# Patient Record
Sex: Female | Born: 1950
Health system: Southern US, Community
[De-identification: ages and names within clinical notes are randomized; demographics above are authoritative.]

## PROBLEM LIST (undated history)

## (undated) DIAGNOSIS — I10 Essential (primary) hypertension: Secondary | ICD-10-CM

## (undated) HISTORY — DX: Essential (primary) hypertension: I10

---

## 2007-07-13 ENCOUNTER — Other Ambulatory Visit: Admission: RE | Admit: 2007-07-13 | Discharge: 2007-07-13 | Payer: Self-pay | Admitting: Family Medicine

## 2008-09-28 ENCOUNTER — Other Ambulatory Visit: Admission: RE | Admit: 2008-09-28 | Discharge: 2008-09-28 | Payer: Self-pay | Admitting: Family Medicine

## 2009-09-30 ENCOUNTER — Other Ambulatory Visit: Admission: RE | Admit: 2009-09-30 | Discharge: 2009-09-30 | Payer: Self-pay | Admitting: Family Medicine

## 2010-06-18 ENCOUNTER — Emergency Department (HOSPITAL_COMMUNITY): Payer: BC Managed Care – PPO

## 2010-06-18 ENCOUNTER — Emergency Department (HOSPITAL_COMMUNITY)
Admission: EM | Admit: 2010-06-18 | Discharge: 2010-06-18 | Disposition: A | Discharge status: Home / Self Care | Payer: BC Managed Care – PPO | Attending: Emergency Medicine | Admitting: Emergency Medicine

## 2010-06-18 DIAGNOSIS — R42 Dizziness and giddiness: Secondary | ICD-10-CM | POA: Insufficient documentation

## 2010-06-18 DIAGNOSIS — R002 Palpitations: Secondary | ICD-10-CM | POA: Insufficient documentation

## 2010-06-18 DIAGNOSIS — R5381 Other malaise: Secondary | ICD-10-CM | POA: Insufficient documentation

## 2010-06-18 DIAGNOSIS — E860 Dehydration: Secondary | ICD-10-CM | POA: Insufficient documentation

## 2010-06-18 DIAGNOSIS — I498 Other specified cardiac arrhythmias: Secondary | ICD-10-CM | POA: Insufficient documentation

## 2010-06-18 DIAGNOSIS — Z79899 Other long term (current) drug therapy: Secondary | ICD-10-CM | POA: Insufficient documentation

## 2010-06-18 LAB — DIFFERENTIAL
Basophils Absolute: 0.1 10*3/uL (ref 0.0–0.1)
Basophils Relative: 1 % (ref 0–1)
Eosinophils Absolute: 0.1 10*3/uL (ref 0.0–0.7)
Eosinophils Relative: 1 % (ref 0–5)
Lymphocytes Relative: 17 % (ref 12–46)
Lymphs Abs: 1.6 10*3/uL (ref 0.7–4.0)
Monocytes Absolute: 0.8 10*3/uL (ref 0.1–1.0)
Monocytes Relative: 8 % (ref 3–12)
Neutro Abs: 7.3 10*3/uL (ref 1.7–7.7)
Neutrophils Relative %: 74 % (ref 43–77)

## 2010-06-18 LAB — BASIC METABOLIC PANEL
BUN: 15 mg/dL (ref 6–23)
CO2: 25 mEq/L (ref 19–32)
Calcium: 8.9 mg/dL (ref 8.4–10.5)
Chloride: 104 mEq/L (ref 96–112)
Creatinine, Ser: 1.05 mg/dL (ref 0.4–1.2)
GFR calc Af Amer: >60 mL/min (ref >60)
GFR calc non Af Amer: 54 mL/min — ABNORMAL LOW (ref >60)
Glucose, Bld: 143 mg/dL — ABNORMAL HIGH (ref 70–99)
Potassium: 3.9 mEq/L (ref 3.5–5.1)
Sodium: 139 mEq/L (ref 135–145)

## 2010-06-18 LAB — TROPONIN I: Troponin I: <0.01 ng/mL (ref 0.00–0.06)

## 2010-06-18 LAB — CBC
HCT: 37.8 % (ref 36.0–46.0)
Hemoglobin: 12.9 g/dL (ref 12.0–15.0)
MCH: 31.9 pg (ref 26.0–34.0)
MCHC: 34.1 g/dL (ref 30.0–36.0)
MCV: 93.3 fL (ref 78.0–100.0)
Platelets: 225 10*3/uL (ref 150–400)
RBC: 4.05 MIL/uL (ref 3.87–5.11)
RDW: 11.8 % (ref 11.5–15.5)
WBC: 9.9 10*3/uL (ref 4.0–10.5)

## 2010-06-18 LAB — D-DIMER, QUANTITATIVE: D-Dimer, Quant: 0.31 ug/mL-FEU (ref 0.00–0.48)

## 2010-06-18 LAB — CK TOTAL AND CKMB (NOT AT ARMC)
CK, MB: 2.2 ng/mL (ref 0.3–4.0)
Relative Index: INVALID (ref 0.0–2.5)
Total CK: 73 U/L (ref 7–177)

## 2010-07-24 ENCOUNTER — Other Ambulatory Visit: Payer: Self-pay | Admitting: Emergency Medicine

## 2010-07-24 ENCOUNTER — Ambulatory Visit
Admission: RE | Admit: 2010-07-24 | Discharge: 2010-07-24 | Disposition: A | Discharge status: Home / Self Care | Payer: Worker's Compensation | Source: Ambulatory Visit | Attending: Emergency Medicine | Admitting: Emergency Medicine

## 2010-07-24 DIAGNOSIS — R519 Headache, unspecified: Secondary | ICD-10-CM

## 2010-10-30 ENCOUNTER — Other Ambulatory Visit (HOSPITAL_COMMUNITY)
Admission: RE | Admit: 2010-10-30 | Discharge: 2010-10-30 | Disposition: A | Discharge status: Home / Self Care | Payer: BC Managed Care – PPO | Source: Ambulatory Visit | Attending: Family Medicine | Admitting: Family Medicine

## 2010-10-30 DIAGNOSIS — Z124 Encounter for screening for malignant neoplasm of cervix: Secondary | ICD-10-CM | POA: Insufficient documentation

## 2014-09-04 ENCOUNTER — Other Ambulatory Visit: Payer: Self-pay | Admitting: Family Medicine

## 2014-09-04 ENCOUNTER — Other Ambulatory Visit (HOSPITAL_COMMUNITY)
Admission: RE | Admit: 2014-09-04 | Discharge: 2014-09-04 | Disposition: A | Discharge status: Home / Self Care | Payer: BLUE CROSS/BLUE SHIELD | Source: Ambulatory Visit | Attending: Family Medicine | Admitting: Family Medicine

## 2014-09-04 DIAGNOSIS — Z124 Encounter for screening for malignant neoplasm of cervix: Secondary | ICD-10-CM | POA: Insufficient documentation

## 2014-09-07 LAB — CYTOLOGY - PAP

## 2015-08-27 DIAGNOSIS — R002 Palpitations: Secondary | ICD-10-CM | POA: Diagnosis not present

## 2015-08-29 ENCOUNTER — Ambulatory Visit (INDEPENDENT_AMBULATORY_CARE_PROVIDER_SITE_OTHER): Payer: BLUE CROSS/BLUE SHIELD

## 2015-08-29 DIAGNOSIS — R002 Palpitations: Secondary | ICD-10-CM | POA: Diagnosis not present

## 2015-09-16 DIAGNOSIS — N951 Menopausal and female climacteric states: Secondary | ICD-10-CM | POA: Diagnosis not present

## 2015-09-16 DIAGNOSIS — J309 Allergic rhinitis, unspecified: Secondary | ICD-10-CM | POA: Diagnosis not present

## 2015-09-16 DIAGNOSIS — I10 Essential (primary) hypertension: Secondary | ICD-10-CM | POA: Diagnosis not present

## 2015-09-16 DIAGNOSIS — Z0000 Encounter for general adult medical examination without abnormal findings: Secondary | ICD-10-CM | POA: Diagnosis not present

## 2016-09-29 DIAGNOSIS — N951 Menopausal and female climacteric states: Secondary | ICD-10-CM | POA: Diagnosis not present

## 2016-09-29 DIAGNOSIS — N898 Other specified noninflammatory disorders of vagina: Secondary | ICD-10-CM | POA: Diagnosis not present

## 2016-09-29 DIAGNOSIS — I10 Essential (primary) hypertension: Secondary | ICD-10-CM | POA: Diagnosis not present

## 2016-09-29 DIAGNOSIS — R002 Palpitations: Secondary | ICD-10-CM | POA: Diagnosis not present

## 2016-09-29 DIAGNOSIS — Z0000 Encounter for general adult medical examination without abnormal findings: Secondary | ICD-10-CM | POA: Diagnosis not present

## 2017-10-20 DIAGNOSIS — S30814A Abrasion of vagina and vulva, initial encounter: Secondary | ICD-10-CM | POA: Diagnosis not present

## 2017-10-20 DIAGNOSIS — Z23 Encounter for immunization: Secondary | ICD-10-CM | POA: Diagnosis not present

## 2017-10-20 DIAGNOSIS — I10 Essential (primary) hypertension: Secondary | ICD-10-CM | POA: Diagnosis not present

## 2017-10-20 DIAGNOSIS — Z0000 Encounter for general adult medical examination without abnormal findings: Secondary | ICD-10-CM | POA: Diagnosis not present

## 2017-11-04 ENCOUNTER — Other Ambulatory Visit: Payer: Self-pay | Admitting: Family Medicine

## 2017-11-04 DIAGNOSIS — S060X0A Concussion without loss of consciousness, initial encounter: Secondary | ICD-10-CM

## 2017-11-04 DIAGNOSIS — S060X9A Concussion with loss of consciousness of unspecified duration, initial encounter: Secondary | ICD-10-CM | POA: Diagnosis not present

## 2017-11-04 DIAGNOSIS — S199XXA Unspecified injury of neck, initial encounter: Secondary | ICD-10-CM | POA: Diagnosis not present

## 2017-11-05 ENCOUNTER — Other Ambulatory Visit: Payer: Self-pay | Admitting: Family Medicine

## 2017-11-05 ENCOUNTER — Ambulatory Visit
Admission: RE | Admit: 2017-11-05 | Discharge: 2017-11-05 | Disposition: A | Discharge status: Home / Self Care | Payer: BLUE CROSS/BLUE SHIELD | Source: Ambulatory Visit | Attending: Family Medicine | Admitting: Family Medicine

## 2017-11-05 DIAGNOSIS — M5031 Other cervical disc degeneration,  high cervical region: Secondary | ICD-10-CM | POA: Diagnosis not present

## 2017-11-05 DIAGNOSIS — T1490XA Injury, unspecified, initial encounter: Secondary | ICD-10-CM

## 2017-11-16 DIAGNOSIS — S060X9A Concussion with loss of consciousness of unspecified duration, initial encounter: Secondary | ICD-10-CM | POA: Diagnosis not present

## 2017-11-16 DIAGNOSIS — S199XXA Unspecified injury of neck, initial encounter: Secondary | ICD-10-CM | POA: Diagnosis not present

## 2017-11-22 DIAGNOSIS — M542 Cervicalgia: Secondary | ICD-10-CM | POA: Diagnosis not present

## 2017-11-22 DIAGNOSIS — M545 Low back pain: Secondary | ICD-10-CM | POA: Diagnosis not present

## 2017-11-22 DIAGNOSIS — S060X0D Concussion without loss of consciousness, subsequent encounter: Secondary | ICD-10-CM | POA: Diagnosis not present

## 2017-11-24 DIAGNOSIS — M545 Low back pain: Secondary | ICD-10-CM | POA: Diagnosis not present

## 2017-11-24 DIAGNOSIS — S060X0D Concussion without loss of consciousness, subsequent encounter: Secondary | ICD-10-CM | POA: Diagnosis not present

## 2017-11-24 DIAGNOSIS — M542 Cervicalgia: Secondary | ICD-10-CM | POA: Diagnosis not present

## 2017-11-30 DIAGNOSIS — M542 Cervicalgia: Secondary | ICD-10-CM | POA: Diagnosis not present

## 2017-11-30 DIAGNOSIS — M545 Low back pain: Secondary | ICD-10-CM | POA: Diagnosis not present

## 2017-11-30 DIAGNOSIS — S060X0D Concussion without loss of consciousness, subsequent encounter: Secondary | ICD-10-CM | POA: Diagnosis not present

## 2017-12-02 DIAGNOSIS — M542 Cervicalgia: Secondary | ICD-10-CM | POA: Diagnosis not present

## 2017-12-02 DIAGNOSIS — S060X0D Concussion without loss of consciousness, subsequent encounter: Secondary | ICD-10-CM | POA: Diagnosis not present

## 2017-12-02 DIAGNOSIS — M545 Low back pain: Secondary | ICD-10-CM | POA: Diagnosis not present

## 2017-12-08 DIAGNOSIS — M545 Low back pain: Secondary | ICD-10-CM | POA: Diagnosis not present

## 2017-12-08 DIAGNOSIS — M542 Cervicalgia: Secondary | ICD-10-CM | POA: Diagnosis not present

## 2017-12-08 DIAGNOSIS — S060X0D Concussion without loss of consciousness, subsequent encounter: Secondary | ICD-10-CM | POA: Diagnosis not present

## 2017-12-09 DIAGNOSIS — L678 Other hair color and hair shaft abnormalities: Secondary | ICD-10-CM | POA: Diagnosis not present

## 2017-12-09 DIAGNOSIS — M549 Dorsalgia, unspecified: Secondary | ICD-10-CM | POA: Diagnosis not present

## 2017-12-09 DIAGNOSIS — M542 Cervicalgia: Secondary | ICD-10-CM | POA: Diagnosis not present

## 2017-12-10 DIAGNOSIS — M545 Low back pain: Secondary | ICD-10-CM | POA: Diagnosis not present

## 2017-12-10 DIAGNOSIS — M542 Cervicalgia: Secondary | ICD-10-CM | POA: Diagnosis not present

## 2017-12-10 DIAGNOSIS — S060X0D Concussion without loss of consciousness, subsequent encounter: Secondary | ICD-10-CM | POA: Diagnosis not present

## 2017-12-14 DIAGNOSIS — M545 Low back pain: Secondary | ICD-10-CM | POA: Diagnosis not present

## 2017-12-14 DIAGNOSIS — S060X0D Concussion without loss of consciousness, subsequent encounter: Secondary | ICD-10-CM | POA: Diagnosis not present

## 2017-12-14 DIAGNOSIS — M542 Cervicalgia: Secondary | ICD-10-CM | POA: Diagnosis not present

## 2017-12-16 DIAGNOSIS — M545 Low back pain: Secondary | ICD-10-CM | POA: Diagnosis not present

## 2017-12-16 DIAGNOSIS — S060X0D Concussion without loss of consciousness, subsequent encounter: Secondary | ICD-10-CM | POA: Diagnosis not present

## 2017-12-16 DIAGNOSIS — M542 Cervicalgia: Secondary | ICD-10-CM | POA: Diagnosis not present

## 2017-12-21 DIAGNOSIS — M545 Low back pain: Secondary | ICD-10-CM | POA: Diagnosis not present

## 2017-12-21 DIAGNOSIS — S060X0D Concussion without loss of consciousness, subsequent encounter: Secondary | ICD-10-CM | POA: Diagnosis not present

## 2017-12-21 DIAGNOSIS — M542 Cervicalgia: Secondary | ICD-10-CM | POA: Diagnosis not present

## 2017-12-28 DIAGNOSIS — M545 Low back pain: Secondary | ICD-10-CM | POA: Diagnosis not present

## 2017-12-28 DIAGNOSIS — M542 Cervicalgia: Secondary | ICD-10-CM | POA: Diagnosis not present

## 2017-12-28 DIAGNOSIS — S060X0D Concussion without loss of consciousness, subsequent encounter: Secondary | ICD-10-CM | POA: Diagnosis not present

## 2018-01-11 DIAGNOSIS — S060X0D Concussion without loss of consciousness, subsequent encounter: Secondary | ICD-10-CM | POA: Diagnosis not present

## 2018-01-11 DIAGNOSIS — M542 Cervicalgia: Secondary | ICD-10-CM | POA: Diagnosis not present

## 2018-01-11 DIAGNOSIS — M545 Low back pain: Secondary | ICD-10-CM | POA: Diagnosis not present

## 2018-01-18 DIAGNOSIS — M545 Low back pain: Secondary | ICD-10-CM | POA: Diagnosis not present

## 2018-01-18 DIAGNOSIS — M542 Cervicalgia: Secondary | ICD-10-CM | POA: Diagnosis not present

## 2018-01-18 DIAGNOSIS — S060X0D Concussion without loss of consciousness, subsequent encounter: Secondary | ICD-10-CM | POA: Diagnosis not present

## 2018-01-25 DIAGNOSIS — M542 Cervicalgia: Secondary | ICD-10-CM | POA: Diagnosis not present

## 2018-01-25 DIAGNOSIS — M545 Low back pain: Secondary | ICD-10-CM | POA: Diagnosis not present

## 2018-01-25 DIAGNOSIS — S060X0D Concussion without loss of consciousness, subsequent encounter: Secondary | ICD-10-CM | POA: Diagnosis not present

## 2018-10-25 DIAGNOSIS — Z0000 Encounter for general adult medical examination without abnormal findings: Secondary | ICD-10-CM | POA: Diagnosis not present

## 2018-10-26 DIAGNOSIS — Z23 Encounter for immunization: Secondary | ICD-10-CM | POA: Diagnosis not present

## 2018-10-26 DIAGNOSIS — Z1159 Encounter for screening for other viral diseases: Secondary | ICD-10-CM | POA: Diagnosis not present

## 2018-10-26 DIAGNOSIS — I10 Essential (primary) hypertension: Secondary | ICD-10-CM | POA: Diagnosis not present

## 2018-10-28 ENCOUNTER — Other Ambulatory Visit: Payer: Self-pay | Admitting: Family Medicine

## 2018-10-28 DIAGNOSIS — Z1231 Encounter for screening mammogram for malignant neoplasm of breast: Secondary | ICD-10-CM

## 2018-10-28 DIAGNOSIS — E2839 Other primary ovarian failure: Secondary | ICD-10-CM

## 2019-01-02 DIAGNOSIS — Z86010 Personal history of colonic polyps: Secondary | ICD-10-CM | POA: Diagnosis not present

## 2019-01-06 ENCOUNTER — Ambulatory Visit
Admission: RE | Admit: 2019-01-06 | Discharge: 2019-01-06 | Disposition: A | Discharge status: Home / Self Care | Payer: BLUE CROSS/BLUE SHIELD | Source: Ambulatory Visit | Attending: Family Medicine | Admitting: Family Medicine

## 2019-01-06 ENCOUNTER — Other Ambulatory Visit: Payer: Self-pay

## 2019-01-06 DIAGNOSIS — Z780 Asymptomatic menopausal state: Secondary | ICD-10-CM | POA: Diagnosis not present

## 2019-01-06 DIAGNOSIS — Z1231 Encounter for screening mammogram for malignant neoplasm of breast: Secondary | ICD-10-CM | POA: Diagnosis not present

## 2019-01-06 DIAGNOSIS — M85852 Other specified disorders of bone density and structure, left thigh: Secondary | ICD-10-CM | POA: Diagnosis not present

## 2019-01-06 DIAGNOSIS — E2839 Other primary ovarian failure: Secondary | ICD-10-CM

## 2019-01-09 ENCOUNTER — Other Ambulatory Visit: Payer: Self-pay | Admitting: Family Medicine

## 2019-01-09 DIAGNOSIS — R928 Other abnormal and inconclusive findings on diagnostic imaging of breast: Secondary | ICD-10-CM

## 2019-01-11 ENCOUNTER — Ambulatory Visit
Admission: RE | Admit: 2019-01-11 | Discharge: 2019-01-11 | Disposition: A | Discharge status: Home / Self Care | Payer: BC Managed Care – PPO | Source: Ambulatory Visit | Attending: Family Medicine | Admitting: Family Medicine

## 2019-01-11 ENCOUNTER — Other Ambulatory Visit: Payer: Self-pay

## 2019-01-11 DIAGNOSIS — R928 Other abnormal and inconclusive findings on diagnostic imaging of breast: Secondary | ICD-10-CM

## 2019-02-07 DIAGNOSIS — Z1159 Encounter for screening for other viral diseases: Secondary | ICD-10-CM | POA: Diagnosis not present

## 2019-02-10 DIAGNOSIS — Z86010 Personal history of colonic polyps: Secondary | ICD-10-CM | POA: Diagnosis not present

## 2019-05-04 DIAGNOSIS — Z03818 Encounter for observation for suspected exposure to other biological agents ruled out: Secondary | ICD-10-CM | POA: Diagnosis not present

## 2019-12-08 DIAGNOSIS — Z0000 Encounter for general adult medical examination without abnormal findings: Secondary | ICD-10-CM | POA: Diagnosis not present

## 2019-12-08 DIAGNOSIS — I10 Essential (primary) hypertension: Secondary | ICD-10-CM | POA: Diagnosis not present

## 2019-12-08 DIAGNOSIS — N951 Menopausal and female climacteric states: Secondary | ICD-10-CM | POA: Diagnosis not present

## 2019-12-08 DIAGNOSIS — R002 Palpitations: Secondary | ICD-10-CM | POA: Diagnosis not present

## 2019-12-08 DIAGNOSIS — Z13220 Encounter for screening for lipoid disorders: Secondary | ICD-10-CM | POA: Diagnosis not present

## 2019-12-08 DIAGNOSIS — J309 Allergic rhinitis, unspecified: Secondary | ICD-10-CM | POA: Diagnosis not present

## 2020-01-11 DIAGNOSIS — I129 Hypertensive chronic kidney disease with stage 1 through stage 4 chronic kidney disease, or unspecified chronic kidney disease: Secondary | ICD-10-CM | POA: Diagnosis not present

## 2020-01-11 DIAGNOSIS — N183 Chronic kidney disease, stage 3 unspecified: Secondary | ICD-10-CM | POA: Diagnosis not present

## 2020-01-11 DIAGNOSIS — J309 Allergic rhinitis, unspecified: Secondary | ICD-10-CM | POA: Diagnosis not present

## 2020-02-29 ENCOUNTER — Other Ambulatory Visit: Payer: Self-pay | Admitting: Family Medicine

## 2020-02-29 DIAGNOSIS — Z1231 Encounter for screening mammogram for malignant neoplasm of breast: Secondary | ICD-10-CM

## 2020-03-01 ENCOUNTER — Other Ambulatory Visit: Payer: Self-pay

## 2020-03-01 ENCOUNTER — Ambulatory Visit
Admission: RE | Admit: 2020-03-01 | Discharge: 2020-03-01 | Disposition: A | Discharge status: Home / Self Care | Payer: BC Managed Care – PPO | Source: Ambulatory Visit | Attending: Family Medicine | Admitting: Family Medicine

## 2020-03-01 DIAGNOSIS — Z1231 Encounter for screening mammogram for malignant neoplasm of breast: Secondary | ICD-10-CM | POA: Diagnosis not present

## 2020-03-07 DIAGNOSIS — N183 Chronic kidney disease, stage 3 unspecified: Secondary | ICD-10-CM | POA: Diagnosis not present

## 2020-03-07 DIAGNOSIS — N950 Postmenopausal bleeding: Secondary | ICD-10-CM | POA: Diagnosis not present

## 2020-03-07 DIAGNOSIS — R319 Hematuria, unspecified: Secondary | ICD-10-CM | POA: Diagnosis not present

## 2020-03-27 DIAGNOSIS — N858 Other specified noninflammatory disorders of uterus: Secondary | ICD-10-CM | POA: Diagnosis not present

## 2020-03-27 DIAGNOSIS — N950 Postmenopausal bleeding: Secondary | ICD-10-CM | POA: Diagnosis not present

## 2020-04-08 DIAGNOSIS — N858 Other specified noninflammatory disorders of uterus: Secondary | ICD-10-CM | POA: Diagnosis not present

## 2020-04-08 DIAGNOSIS — N950 Postmenopausal bleeding: Secondary | ICD-10-CM | POA: Diagnosis not present

## 2020-05-21 DIAGNOSIS — I129 Hypertensive chronic kidney disease with stage 1 through stage 4 chronic kidney disease, or unspecified chronic kidney disease: Secondary | ICD-10-CM | POA: Diagnosis not present

## 2020-06-10 DIAGNOSIS — I129 Hypertensive chronic kidney disease with stage 1 through stage 4 chronic kidney disease, or unspecified chronic kidney disease: Secondary | ICD-10-CM | POA: Diagnosis not present

## 2020-06-10 DIAGNOSIS — E7800 Pure hypercholesterolemia, unspecified: Secondary | ICD-10-CM | POA: Diagnosis not present

## 2020-06-10 DIAGNOSIS — J309 Allergic rhinitis, unspecified: Secondary | ICD-10-CM | POA: Diagnosis not present

## 2020-09-30 DIAGNOSIS — K625 Hemorrhage of anus and rectum: Secondary | ICD-10-CM | POA: Diagnosis not present

## 2020-12-12 DIAGNOSIS — Z0000 Encounter for general adult medical examination without abnormal findings: Secondary | ICD-10-CM | POA: Diagnosis not present

## 2020-12-12 DIAGNOSIS — Z23 Encounter for immunization: Secondary | ICD-10-CM | POA: Diagnosis not present

## 2020-12-12 DIAGNOSIS — I129 Hypertensive chronic kidney disease with stage 1 through stage 4 chronic kidney disease, or unspecified chronic kidney disease: Secondary | ICD-10-CM | POA: Diagnosis not present

## 2020-12-12 DIAGNOSIS — E7800 Pure hypercholesterolemia, unspecified: Secondary | ICD-10-CM | POA: Diagnosis not present

## 2021-02-03 ENCOUNTER — Other Ambulatory Visit: Payer: Self-pay | Admitting: Family Medicine

## 2021-02-03 DIAGNOSIS — Z1231 Encounter for screening mammogram for malignant neoplasm of breast: Secondary | ICD-10-CM

## 2021-02-04 DIAGNOSIS — R079 Chest pain, unspecified: Secondary | ICD-10-CM | POA: Diagnosis not present

## 2021-02-04 DIAGNOSIS — K219 Gastro-esophageal reflux disease without esophagitis: Secondary | ICD-10-CM | POA: Diagnosis not present

## 2021-02-13 DIAGNOSIS — Z23 Encounter for immunization: Secondary | ICD-10-CM | POA: Diagnosis not present

## 2021-03-12 ENCOUNTER — Other Ambulatory Visit: Payer: Self-pay

## 2021-03-12 ENCOUNTER — Ambulatory Visit
Admission: RE | Admit: 2021-03-12 | Discharge: 2021-03-12 | Disposition: A | Discharge status: Home / Self Care | Payer: BC Managed Care – PPO | Source: Ambulatory Visit | Attending: Family Medicine | Admitting: Family Medicine

## 2021-03-12 DIAGNOSIS — Z1231 Encounter for screening mammogram for malignant neoplasm of breast: Secondary | ICD-10-CM

## 2021-07-15 DIAGNOSIS — E7800 Pure hypercholesterolemia, unspecified: Secondary | ICD-10-CM | POA: Diagnosis not present

## 2021-12-16 DIAGNOSIS — K219 Gastro-esophageal reflux disease without esophagitis: Secondary | ICD-10-CM | POA: Diagnosis not present

## 2021-12-16 DIAGNOSIS — M8588 Other specified disorders of bone density and structure, other site: Secondary | ICD-10-CM | POA: Diagnosis not present

## 2021-12-16 DIAGNOSIS — E7800 Pure hypercholesterolemia, unspecified: Secondary | ICD-10-CM | POA: Diagnosis not present

## 2021-12-16 DIAGNOSIS — Z0000 Encounter for general adult medical examination without abnormal findings: Secondary | ICD-10-CM | POA: Diagnosis not present

## 2021-12-16 DIAGNOSIS — R002 Palpitations: Secondary | ICD-10-CM | POA: Diagnosis not present

## 2021-12-16 DIAGNOSIS — I129 Hypertensive chronic kidney disease with stage 1 through stage 4 chronic kidney disease, or unspecified chronic kidney disease: Secondary | ICD-10-CM | POA: Diagnosis not present

## 2021-12-18 ENCOUNTER — Other Ambulatory Visit: Payer: Self-pay | Admitting: Family Medicine

## 2021-12-18 DIAGNOSIS — M8589 Other specified disorders of bone density and structure, multiple sites: Secondary | ICD-10-CM

## 2022-03-09 ENCOUNTER — Other Ambulatory Visit: Payer: Self-pay | Admitting: Internal Medicine

## 2022-03-09 DIAGNOSIS — Z1231 Encounter for screening mammogram for malignant neoplasm of breast: Secondary | ICD-10-CM

## 2022-03-11 DIAGNOSIS — L719 Rosacea, unspecified: Secondary | ICD-10-CM | POA: Diagnosis not present

## 2022-03-11 DIAGNOSIS — L821 Other seborrheic keratosis: Secondary | ICD-10-CM | POA: Diagnosis not present

## 2022-03-13 ENCOUNTER — Ambulatory Visit
Admission: EM | Admit: 2022-03-13 | Discharge: 2022-03-13 | Disposition: A | Discharge status: Home / Self Care | Payer: BC Managed Care – PPO | Attending: Family Medicine | Admitting: Family Medicine

## 2022-03-13 ENCOUNTER — Encounter: Payer: Self-pay | Admitting: Emergency Medicine

## 2022-03-13 DIAGNOSIS — J069 Acute upper respiratory infection, unspecified: Secondary | ICD-10-CM | POA: Diagnosis not present

## 2022-03-13 DIAGNOSIS — Z1152 Encounter for screening for COVID-19: Secondary | ICD-10-CM | POA: Diagnosis not present

## 2022-03-13 DIAGNOSIS — R059 Cough, unspecified: Secondary | ICD-10-CM | POA: Insufficient documentation

## 2022-03-13 LAB — RESP PANEL BY RT-PCR (RSV, FLU A&B, COVID)  RVPGX2
Influenza A by PCR: NEGATIVE
Influenza B by PCR: NEGATIVE
Resp Syncytial Virus by PCR: NEGATIVE
SARS Coronavirus 2 by RT PCR: NEGATIVE

## 2022-03-13 NOTE — ED Triage Notes (Signed)
Patient c/o body aches, productive cough, head and chest congestion x 3 days.  Patient has taken Tylenol and an OTC nasal spray.  Patient has not flu shot.

## 2022-03-13 NOTE — Discharge Instructions (Addendum)
Take plain guaifenesin (1200mg  extended release tabs such as Mucinex) twice daily, with plenty of water, for cough and congestion.  Get adequate rest.   For sinus congestion may use Afrin nasal spray (or generic oxymetazoline) each morning for about 5 days and then discontinue.  Also recommend using saline nasal spray several times daily and saline nasal irrigation (AYR is a common brand).  Use Flonase nasal spray each morning after using Afrin nasal spray and saline nasal irrigation. Try warm salt water gargles for sore throat.  Stop all antihistamines (Allegra, etc) for now, and other non-prescription cough/cold preparations. May take Delsym Cough Suppressant ("12 Hour Cough Relief") at bedtime for nighttime cough.   If symptoms become significantly worse during the night or over the weekend, proceed to the local emergency room.

## 2022-03-13 NOTE — ED Provider Notes (Signed)
[facility]    CSN: [id number] Arrival date & time: 03/13/22  3474      History   Chief Complaint Chief Complaint  Patient presents with   Generalized Body Aches    HPI Cyrstal Leitz is a 71 y.o. female.   Three days ago patient suddenly developed fatigue, myalgias, hoarseness, mild headache, and cough. Her ears have felt full and she had sweats last night.  She feels slightly short of breath at times without wheezing or pleuritic pain.  Her husband has similar symptoms. She was with her young grandson several days ago who had cold-like symptoms.  The history is provided by the patient.    History reviewed. No pertinent past medical history.  Patient Active Problem List   Diagnosis Date Noted   Palpitations 08/29/2015    History reviewed. No pertinent surgical history.  OB History   No obstetric history on file.      Home Medications    Prior to Admission medications   Medication Sig Start Date End Date Taking? Authorizing Provider  metoprolol succinate (TOPROL-XL) 25 MG 24 hr tablet Take 1 tablet by mouth daily. 11/14/14  Yes [provider]  Vitamin D, Ergocalciferol, (DRISDOL) 1.25 MG (50000 UNIT) CAPS capsule Take by mouth.   Yes [provider]    Family History Family History  Problem Relation Age of Onset   Breast cancer Maternal Aunt        diagnosed early 46's    Social History Social History   Tobacco Use   Smoking status: Never   Smokeless tobacco: Never  Vaping Use   Vaping Use: Never used  Substance Use Topics   Alcohol use: Yes   Drug use: Never     Allergies   Patient has no known allergies.   Review of Systems Review of Systems + sore throat + hoarse + cough No pleuritic pain No wheezing + nasal congestion + post-nasal drainage No sinus pain/pressure No itchy/red eyes ? Left earache No hemoptysis No SOB ? fever, + sweats No nausea No vomiting No abdominal pain No  diarrhea No urinary symptoms No skin rash + fatigue+ + myalgias + headache Used OTC meds (Allegra) without relief   Physical Exam Triage Vital Signs ED Triage Vitals  Enc Vitals Group     BP 03/13/22 1004 (!) 148/81     Pulse Rate 03/13/22 1004 71     Resp 03/13/22 1004 18     Temp 03/13/22 1004 99.3 F (37.4 C)     Temp Source 03/13/22 1004 Oral     SpO2 03/13/22 1004 97 %     Weight 03/13/22 1005 145 lb (65.8 kg)     Height 03/13/22 1005 5\' 5"  (1.651 m)     Head Circumference --      Peak Flow --      Pain Score 03/13/22 1005 0     Pain Loc --      Pain Edu? --      Excl. in [place]? --    No data found.  Updated Vital Signs BP (!) 148/81 (BP Location: Right Arm)   Pulse 71   Temp 99.3 F (37.4 C) (Oral)   Resp 18   Ht 5\' 5"  (1.651 m)   Wt 65.8 kg   SpO2 97%   BMI 24.13 kg/m   Visual Acuity Right Eye Distance:   Left Eye Distance:   Bilateral Distance:    Right Eye Near:   Left Eye  Near:    Bilateral Near:     Physical Exam Nursing notes and Vital Signs reviewed. Appearance:  Patient appears stated age, and in no acute distress Eyes:  Pupils are equal, round, and reactive to light and accomodation.  Extraocular movement is intact.  Conjunctivae are not inflamed  Ears:  Canals normal.  Tympanic membranes normal.  Nose:  Mildly congested turbinates.  No sinus tenderness.  Pharynx:  Normal Neck:  Supple.  Mildly enlarged lateral nodes are present, tender to palpation on the left.   Lungs:  Clear to auscultation.  Breath sounds are equal.  Moving air well. Heart:  Regular rate and rhythm without murmurs, rubs, or gallops.  Abdomen:  Nontender without masses or hepatosplenomegaly.  Bowel sounds are present.  No CVA or flank tenderness.  Extremities:  No edema.  Skin:  No rash present.   UC Treatments / Results  Labs (all labs ordered are listed, but only abnormal results are displayed) Labs Reviewed  RESP PANEL BY RT-PCR (RSV, FLU A&B, COVID)  [id number]     EKG   Radiology No results found.  Procedures Procedures (including critical care time)  Medications Ordered in UC Medications - No data to display  Initial Impression / Assessment and Plan / UC Course  I have reviewed the triage vital signs and the nursing notes.  Pertinent labs & imaging results that were available during my care of the patient were reviewed by me and considered in my medical decision making (see chart for details).    Benign exam. Treat symptomatically for now. Resp panel pending. Followup with Family Doctor if not improved in one week.   Final Clinical Impressions(s) / UC Diagnoses   Final diagnoses:  Viral URI with cough     Discharge Instructions      Take plain guaifenesin (1200mg  extended release tabs such as Mucinex) twice daily, with plenty of water, for cough and congestion.  Get adequate rest.   For sinus congestion may use Afrin nasal spray (or generic oxymetazoline) each morning for about 5 days and then discontinue.  Also recommend using saline nasal spray several times daily and saline nasal irrigation (AYR is a common brand).  Use Flonase nasal spray each morning after using Afrin nasal spray and saline nasal irrigation. Try warm salt water gargles for sore throat.  Stop all antihistamines (Allegra, etc) for now, and other non-prescription cough/cold preparations. May take Delsym Cough Suppressant ("12 Hour Cough Relief") at bedtime for nighttime cough.   If symptoms become significantly worse during the night or over the weekend, proceed to the local emergency room.      ED Prescriptions   None       , MD 03/15/22 1117

## 2022-03-14 ENCOUNTER — Telehealth: Payer: Self-pay | Admitting: Emergency Medicine

## 2022-03-14 NOTE — Telephone Encounter (Signed)
Call to see if pt had any questions or concerns. No answer - call back # left

## 2022-04-11 IMAGING — MG MM DIGITAL SCREENING BILAT W/ TOMO AND CAD
8 series · 8 of 24 positions shown · non-contrast
Comparison: Previous exam(s).

CLINICAL DATA: Screening.

EXAM:
DIGITAL SCREENING BILATERAL MAMMOGRAM WITH TOMOSYNTHESIS AND CAD
TECHNIQUE: Bilateral screening digital craniocaudal and mediolateral oblique
mammograms were obtained. Bilateral screening digital breast
tomosynthesis was performed. The images were evaluated with
computer-aided detection.

[R CC synth-2D]
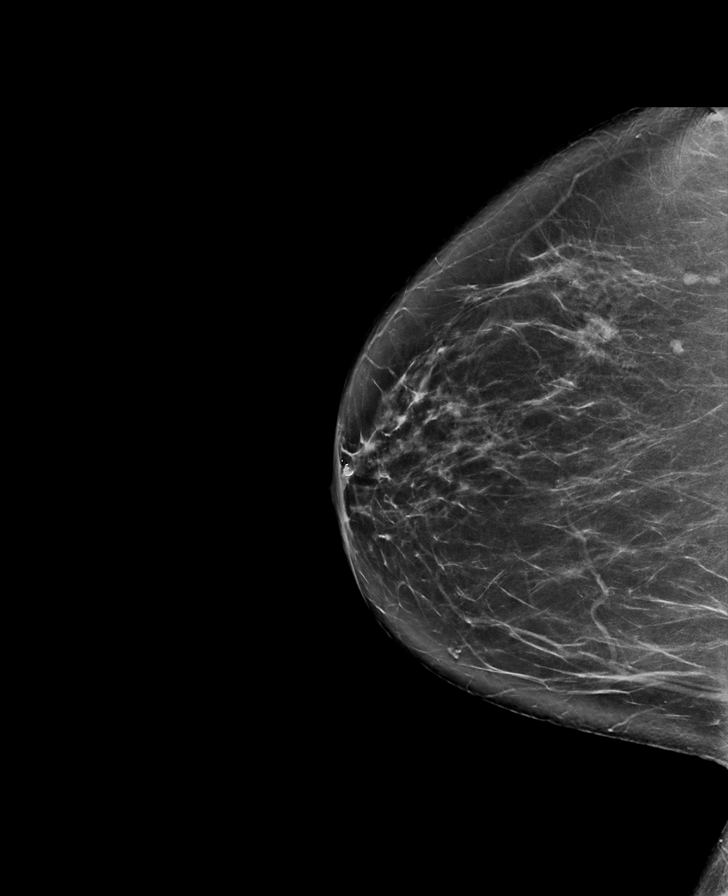

[R MLO synth-2D]
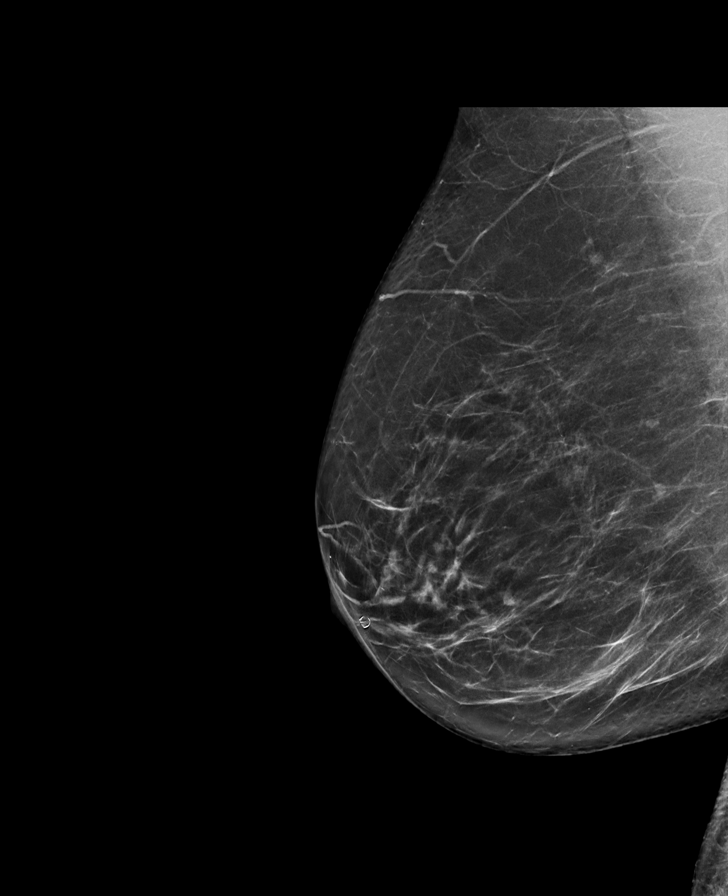

[L MLO synth-2D]
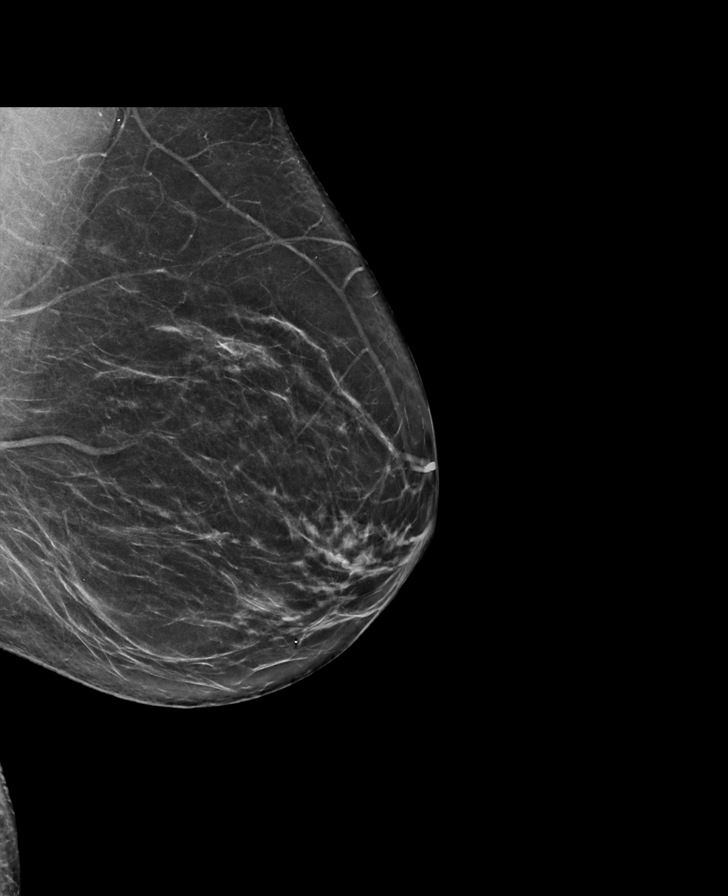

[L CC synth-2D]
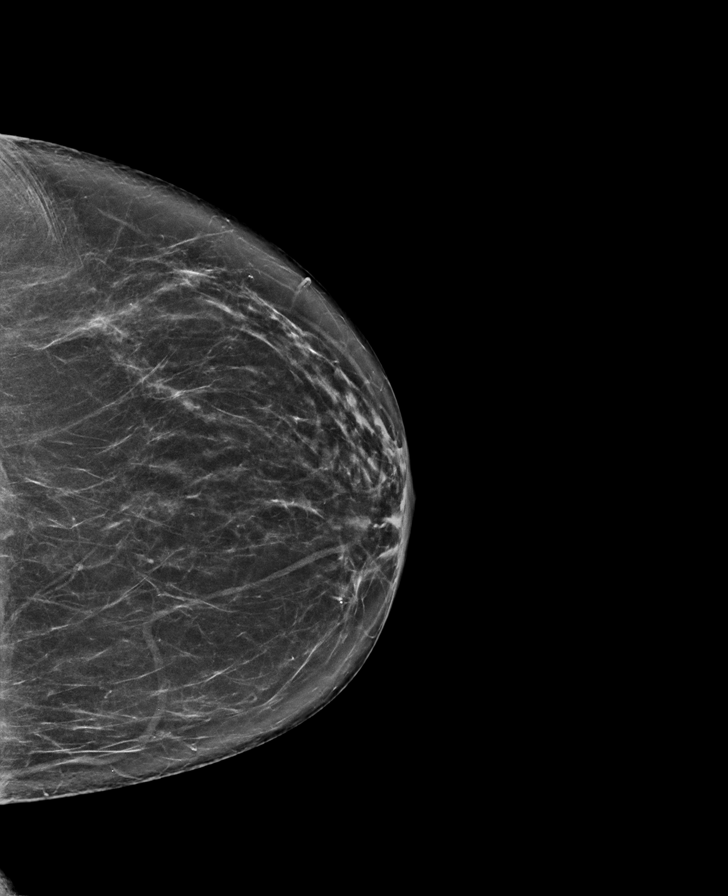

[R MLO tomo · tomo slice 39/76.0]
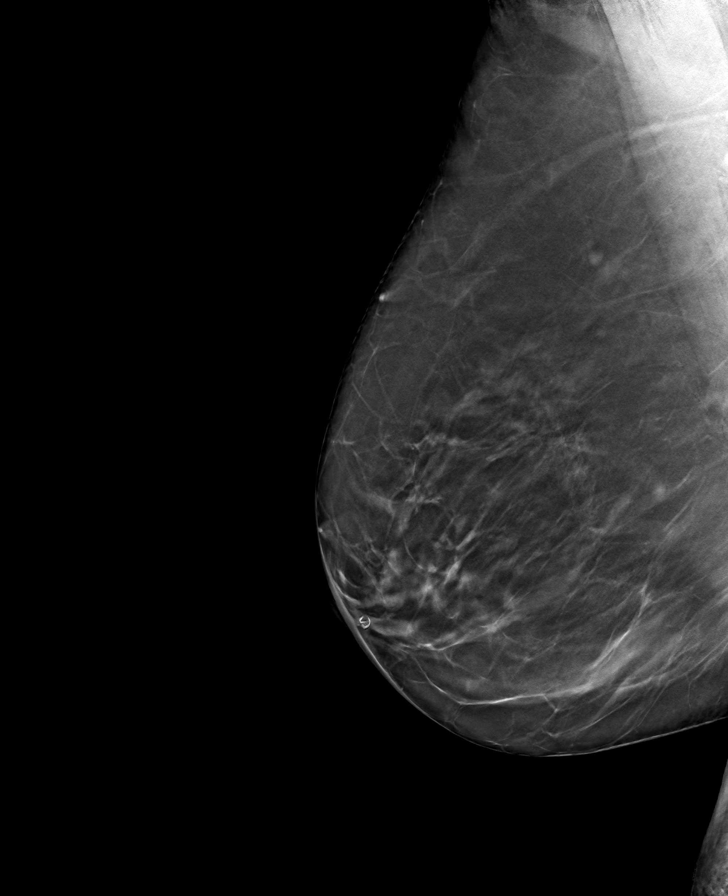

[L CC tomo · tomo slice 35/69.0]
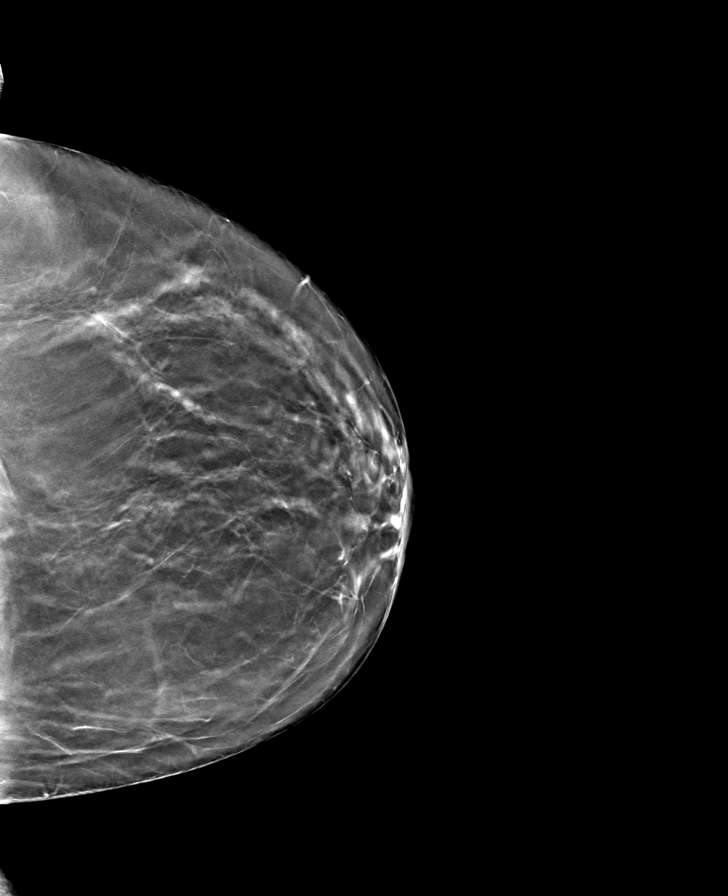

[L MLO tomo · tomo slice 37/74.0]
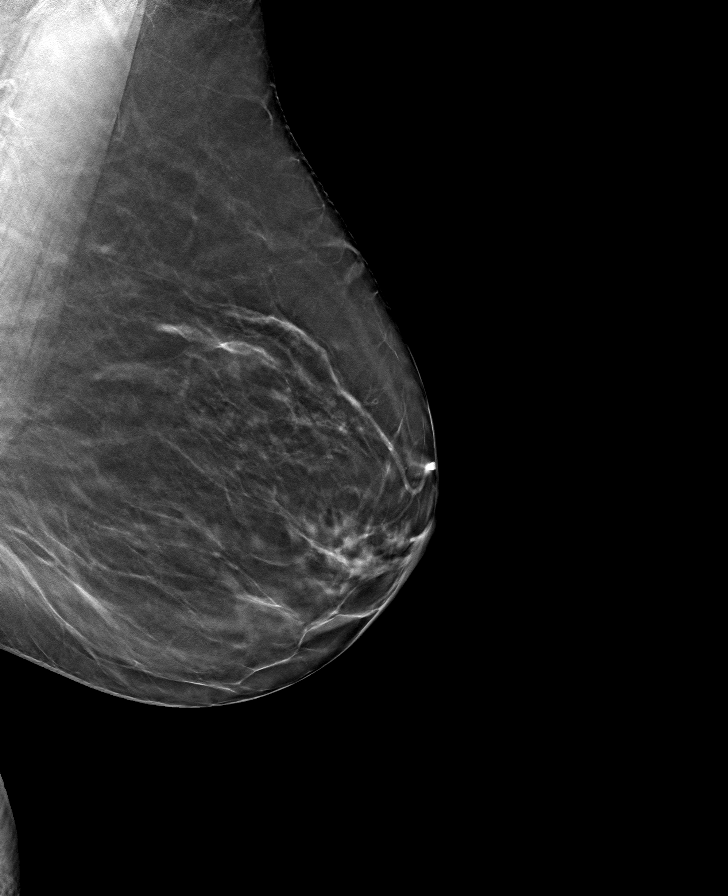

[R CC tomo · tomo slice 39/76.0]
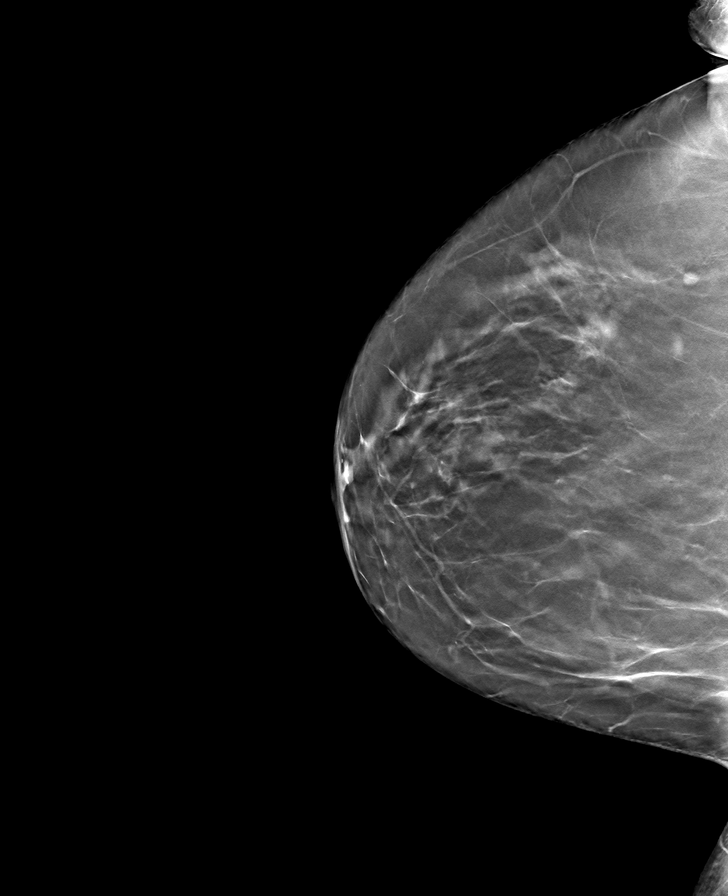

[8 of 24 positions shown; findings below may reference images not displayed]

ACR Breast Density Category b: There are scattered areas of
fibroglandular density.
FINDINGS: There are no findings suspicious for malignancy.
IMPRESSION: No mammographic evidence of malignancy. A result letter of this
screening mammogram will be mailed directly to the patient.

RECOMMENDATION:
Screening mammogram in one year. (Code:51-O-LD2)

BI-RADS CATEGORY  1: Negative.

## 2022-04-30 ENCOUNTER — Ambulatory Visit
Admission: RE | Admit: 2022-04-30 | Discharge: 2022-04-30 | Disposition: A | Discharge status: Home / Self Care | Payer: BC Managed Care – PPO | Source: Ambulatory Visit | Attending: Internal Medicine | Admitting: Internal Medicine

## 2022-04-30 DIAGNOSIS — Z1231 Encounter for screening mammogram for malignant neoplasm of breast: Secondary | ICD-10-CM | POA: Diagnosis not present

## 2022-05-14 DIAGNOSIS — D3131 Benign neoplasm of right choroid: Secondary | ICD-10-CM | POA: Diagnosis not present

## 2022-05-26 DIAGNOSIS — R42 Dizziness and giddiness: Secondary | ICD-10-CM | POA: Diagnosis not present

## 2022-05-26 DIAGNOSIS — I129 Hypertensive chronic kidney disease with stage 1 through stage 4 chronic kidney disease, or unspecified chronic kidney disease: Secondary | ICD-10-CM | POA: Diagnosis not present

## 2022-05-26 DIAGNOSIS — R002 Palpitations: Secondary | ICD-10-CM | POA: Diagnosis not present

## 2022-05-26 DIAGNOSIS — R0789 Other chest pain: Secondary | ICD-10-CM | POA: Diagnosis not present

## 2022-05-27 DIAGNOSIS — R002 Palpitations: Secondary | ICD-10-CM | POA: Diagnosis not present

## 2022-06-05 ENCOUNTER — Ambulatory Visit
Admission: RE | Admit: 2022-06-05 | Discharge: 2022-06-05 | Disposition: A | Discharge status: Home / Self Care | Payer: BC Managed Care – PPO | Source: Ambulatory Visit | Attending: Family Medicine | Admitting: Family Medicine

## 2022-06-05 DIAGNOSIS — Z780 Asymptomatic menopausal state: Secondary | ICD-10-CM | POA: Diagnosis not present

## 2022-06-05 DIAGNOSIS — M8589 Other specified disorders of bone density and structure, multiple sites: Secondary | ICD-10-CM

## 2022-06-05 DIAGNOSIS — M85852 Other specified disorders of bone density and structure, left thigh: Secondary | ICD-10-CM | POA: Diagnosis not present

## 2022-06-16 DIAGNOSIS — R002 Palpitations: Secondary | ICD-10-CM | POA: Diagnosis not present

## 2022-06-17 DIAGNOSIS — G4733 Obstructive sleep apnea (adult) (pediatric): Secondary | ICD-10-CM | POA: Diagnosis not present

## 2022-06-23 DIAGNOSIS — I129 Hypertensive chronic kidney disease with stage 1 through stage 4 chronic kidney disease, or unspecified chronic kidney disease: Secondary | ICD-10-CM | POA: Diagnosis not present

## 2022-06-23 DIAGNOSIS — K219 Gastro-esophageal reflux disease without esophagitis: Secondary | ICD-10-CM | POA: Diagnosis not present

## 2022-06-23 DIAGNOSIS — E7800 Pure hypercholesterolemia, unspecified: Secondary | ICD-10-CM | POA: Diagnosis not present

## 2022-06-23 DIAGNOSIS — G4733 Obstructive sleep apnea (adult) (pediatric): Secondary | ICD-10-CM | POA: Diagnosis not present

## 2022-06-24 DIAGNOSIS — R002 Palpitations: Secondary | ICD-10-CM | POA: Diagnosis not present

## 2022-07-01 ENCOUNTER — Ambulatory Visit: Payer: BC Managed Care – PPO | Admitting: Cardiology

## 2022-07-01 ENCOUNTER — Encounter: Payer: Self-pay | Admitting: Cardiology

## 2022-07-01 VITALS — BP 147/91 | HR 59 | Resp 16 | Ht 65.0 in | Wt 145.6 lb

## 2022-07-01 DIAGNOSIS — I499 Cardiac arrhythmia, unspecified: Secondary | ICD-10-CM | POA: Diagnosis not present

## 2022-07-01 DIAGNOSIS — I10 Essential (primary) hypertension: Secondary | ICD-10-CM

## 2022-07-01 DIAGNOSIS — R072 Precordial pain: Secondary | ICD-10-CM | POA: Insufficient documentation

## 2022-07-01 NOTE — Progress Notes (Signed)
Patient referred by [name], MD for arrhythmia  Subjective:   Natalie Elliott, female    DOB: 1951/01/09, 72 y.o.   MRN: [id number]   Chief Complaint  Patient presents with   Irregular Heart Beat   New Patient (Initial Visit)    HPI  72 y.o. Caucasian female with hypertension, mixed hyperlipidemia, palpitations, chest pain  Patient reports episodes of retrosternal chest pain, with radiation to neck, that sometimes occur with exertion, would last for 2-3 days at a time.  These episodes are associated with palpitations, and lightheadedness.  Patient recently wore Zio patch monitor, which was personally reviewed by me, details below.  Patient reports stress at home, does not get more than 4 hours of sleep at night.   Past Medical History:  Diagnosis Date   Hypertension      History reviewed. No pertinent surgical history.   Social History   Tobacco Use  Smoking Status Never  Smokeless Tobacco Never    Social History   Substance and Sexual Activity  Alcohol Use Yes     Family History  Problem Relation Age of Onset   Breast cancer Maternal Aunt        diagnosed early 98's      Current Outpatient Medications:    metoprolol succinate (TOPROL-XL) 25 MG 24 hr tablet, Take 1 tablet by mouth daily., Disp: , Rfl:    Vitamin D, Ergocalciferol, (DRISDOL) 1.25 MG (50000 UNIT) CAPS capsule, Take by mouth., Disp: , Rfl:    Cardiovascular and other pertinent studies:  Reviewed external labs and tests, independently interpreted  EKG 07/01/2022: Sinus rhythm 60 bpm Early reploarizaiton changes Anterosetpal and lateral T wave abnormality, consider ischemia  Mobile cardiac telemetry 13 days 05/27/2022 - 06/10/2022: Dominant rhythm: Sinus. HR 43-105 bpm. Avg HR 62 bpm, in sinus rhythm. 5 episodes of probable atrial/supraventricular tachycardia, fastest at 156 bpm for 6 beats, longest for 18.1 secs at 99 bpm. <1% isolated SVE, couplets. 0 episodes of  VT <1% isolated VE, couplets. No atrial fibrillation/atrial flutter/VT/high grade AV block, sinus pause >3sec noted. 5 patient triggered events, correlated with sinus rhythm    Recent labs: 05/26/2022: Glucose 93, BUN/Cr 14/0.99. EGFR 61. Na/K 140/4.4.  H/H Hb 13.9  12/2021: Chol 220, TG 66, HDL 68, LDL 141   Review of Systems  Cardiovascular:  Positive for chest pain and palpitations. Negative for dyspnea on exertion, leg swelling and syncope.         Vitals:   07/01/22 0938  BP: (!) 147/91  Pulse: (!) 59  Resp: 16  SpO2: 98%     Body mass index is 24.23 kg/m. Filed Weights   07/01/22 0938  Weight: 145 lb 9.6 oz (66 kg)     Objective:   Physical Exam Vitals and nursing note reviewed.  Constitutional:      General: She is not in acute distress. Neck:     Vascular: No JVD.  Cardiovascular:     Rate and Rhythm: Normal rate and regular rhythm.     Heart sounds: Normal heart sounds. No murmur heard. Pulmonary:     Effort: Pulmonary effort is normal.     Breath sounds: Normal breath sounds. No wheezing or rales.  Musculoskeletal:     Right lower leg: No edema.     Left lower leg: No edema.          Visit diagnoses:   ICD-10-CM   1. Irregular heart beat  I49.9 EKG 12-Lead  2. Precordial pain  R07.2 PCV MYOCARDIAL PERFUSION WO LEXISCAN    PCV ECHOCARDIOGRAM COMPLETE    CT CARDIAC SCORING ([facility] LOCATIONS ONLY)    CANCELED: CT CARDIAC SCORING ([facility] ONLY)       Orders Placed This Encounter  Procedures   CT CARDIAC SCORING ([facility] LOCATIONS ONLY)   PCV MYOCARDIAL PERFUSION WO LEXISCAN   EKG 12-Lead   PCV ECHOCARDIOGRAM COMPLETE      Assessment & Recommendations:   72 y.o. Caucasian female with hypertension, mixed hyperlipidemia, palpitations, chest pain  Chest pain: Patient has both typical and atypical qualities to it.  Given her mixed hyperlipidemia, prior smoking history, recommend exercise nuclear stress test, echocardiogram, calcium  score scan.  Palpitations: Reviewed recent Zio patch monitor tracing.  She does not in fact have ventricular tachycardia, but has had few episodes of atrial tachycardia/SVT.  Stress, and I agree with sleep could be contributing.  Continue metoprolol succinate for now.  No change made at this time.  Resting heart rate is in low 60s, which is good.  Hypertension: Blood pressure generally better controlled than what it is today.  No change made today.  Continue regular monitoring.  Mixed hyperlipidemia: Currently not on statin.  Calcium score scan will help with restratification.  Thank you for referring the patient to [place]. Please feel free to contact with any questions.   Nigel Mormon, MD Pager: [phone] Office: [phone]

## 2022-07-03 ENCOUNTER — Other Ambulatory Visit: Payer: BC Managed Care – PPO

## 2022-07-14 ENCOUNTER — Ambulatory Visit: Payer: BC Managed Care – PPO

## 2022-07-14 DIAGNOSIS — R072 Precordial pain: Secondary | ICD-10-CM

## 2022-07-18 LAB — PCV MYOCARDIAL PERFUSION WO LEXISCAN
Angina Index: 0
ST Depression (mm): 0 mm

## 2022-07-22 DIAGNOSIS — G4733 Obstructive sleep apnea (adult) (pediatric): Secondary | ICD-10-CM | POA: Diagnosis not present

## 2022-07-27 ENCOUNTER — Ambulatory Visit: Payer: BC Managed Care – PPO | Admitting: Cardiology

## 2022-08-19 ENCOUNTER — Ambulatory Visit: Payer: BC Managed Care – PPO

## 2022-08-19 DIAGNOSIS — R072 Precordial pain: Secondary | ICD-10-CM

## 2022-08-20 NOTE — Progress Notes (Signed)
Patient referred by Ollen Bowl, MD for arrhythmia  Subjective:   Natalie Elliott, female    DOB: 02/24/1951, 72 y.o.   MRN: [id number]   Chief Complaint  Patient presents with   Irregular Heart Beat   Follow-up    4-6 week    HPI  72 y.o. Caucasian female with hypertension, mixed hyperlipidemia, palpitations, chest pain  Reviewed recent test results with the patient, details below. She reports that episodes of chest pain after eating certain foods.   Initial consultation visit 06/2022: Patient reports episodes of retrosternal chest pain, with radiation to neck, that sometimes occur with exertion, would last for 2-3 days at a time.  These episodes are associated with palpitations, and lightheadedness.  Patient recently wore Zio patch monitor, which was personally reviewed by me, details below.  Patient reports stress at home, does not get more than 4 hours of sleep at night.   Current Outpatient Medications:    metoprolol succinate (TOPROL-XL) 25 MG 24 hr tablet, Take 1 tablet by mouth daily., Disp: , Rfl:    Vitamin D, Ergocalciferol, (DRISDOL) 1.25 MG (50000 UNIT) CAPS capsule, Take by mouth., Disp: , Rfl:    Cardiovascular and other pertinent studies:  Reviewed external labs and tests, independently interpreted  Echocardiogram 08/19/2022:  Normal LV systolic function with visual EF 55-60%. Left ventricle cavity is normal in size. Normal left ventricular wall thickness. Normal global wall motion. Normal diastolic filling pattern, normal LAP. Calculated EF 58%. Structurally normal mitral valve.  Mild (Grade I) mitral regurgitation. Structurally normal tricuspid valve.  Mild tricuspid regurgitation. No evidence of pulmonary hypertension. No prior available for comparison.  Exercise nuclear stress test 07/14/2022: There is a very small reversible mild defect in the inferior region.  Overall LV systolic function is normal without regional wall motion abnormalities.  Stress LV EF: 67%.  Normal ECG stress. The patient exercised for 4 minutes and 30 seconds of a Bruce protocol, achieving approximately 6.44 METs & 96% MPHR. No chest pain. Fatigue present. The blood pressure response was normal. No previous exam available for comparison. Low risk.        CT cardiac scoring 07/09/2022: 1.  Total calcium score of 13 is between the 25-50 percentile for females between the ages of 64-69.  2.  This is compatible with definite, at least mild atherosclerotic plaque. Risk of coronary artery disease: Mild or minimal coronary narrowings likely.  3.  Small hiatal hernia.   EKG 07/01/2022: Sinus rhythm 60 bpm Early reploarizaiton changes Anterosetpal and lateral T wave abnormality, consider ischemia  Mobile cardiac telemetry 13 days 05/27/2022 - 06/10/2022: Dominant rhythm: Sinus. HR 43-105 bpm. Avg HR 62 bpm, in sinus rhythm. 5 episodes of probable atrial/supraventricular tachycardia, fastest at 156 bpm for 6 beats, longest for 18.1 secs at 99 bpm. <1% isolated SVE, couplets. 0 episodes of VT <1% isolated VE, couplets. No atrial fibrillation/atrial flutter/VT/high grade AV block, sinus pause >3sec noted. 5 patient triggered events, correlated with sinus rhythm    Recent labs: 05/26/2022: Glucose 93, BUN/Cr 14/0.99. EGFR 61. Na/K 140/4.4.  H/H Hb 13.9  12/2021: Chol 220, TG 66, HDL 68, LDL 141   Review of Systems  Cardiovascular:  Positive for chest pain and palpitations. Negative for dyspnea on exertion, leg swelling and syncope.         Vitals:   08/27/22 0924 08/27/22 0926  BP: (!) 173/77 (!) 154/82  Pulse: 62 (!) 59  Resp: 17   SpO2: 100%  Body mass index is 24.79 kg/m. Filed Weights   08/27/22 0924  Weight: 149 lb (67.6 kg)     Objective:   Physical Exam Vitals and nursing note reviewed.  Constitutional:      General: She is not in acute distress. Neck:     Vascular: No JVD.  Cardiovascular:     Rate and Rhythm: Normal rate  and regular rhythm.     Heart sounds: Normal heart sounds. No murmur heard. Pulmonary:     Effort: Pulmonary effort is normal.     Breath sounds: Normal breath sounds. No wheezing or rales.  Musculoskeletal:     Right lower leg: No edema.     Left lower leg: No edema.          Visit diagnoses: No diagnosis found.    No orders of the defined types were placed in this encounter.     Assessment & Recommendations:   72 y.o. Caucasian female with hypertension, mixed hyperlipidemia, palpitations, chest pain  Chest pain: Calcium score 13. No ischemia on stress testing. Structurally normal heart. Suspect chest pain could be due to hiatal hernia and GERD.  Palpitations: N o serious arrhythmia noted.  Continue metoprolol succinate 25 mg for symptom improvement.  Hypertension: Uncontrolled. Added amlodipine 5 mg daily. If BSP consistently remains >140 mmHg, patient will notify me or PCP.  Mixed hyperlipidemia: Chol 220, TG 66, HDL 68, LDL 141 (12/2021). Calcium score 13. In addition to diet and lifestyle changes, added Crestor 10 mg daily. She will get lipid panel in June with PCP.   F?u in 6 months   Elder Negus, MD Pager: [phone] Office: [phone]

## 2022-08-22 DIAGNOSIS — G4733 Obstructive sleep apnea (adult) (pediatric): Secondary | ICD-10-CM | POA: Diagnosis not present

## 2022-08-27 ENCOUNTER — Encounter: Payer: Self-pay | Admitting: Cardiology

## 2022-08-27 ENCOUNTER — Ambulatory Visit: Payer: BC Managed Care – PPO | Admitting: Cardiology

## 2022-08-27 VITALS — BP 154/82 | HR 59 | Resp 17 | Ht 65.0 in | Wt 149.0 lb

## 2022-08-27 DIAGNOSIS — I10 Essential (primary) hypertension: Secondary | ICD-10-CM

## 2022-08-27 DIAGNOSIS — E782 Mixed hyperlipidemia: Secondary | ICD-10-CM | POA: Diagnosis not present

## 2022-08-27 MED ORDER — AMLODIPINE BESYLATE 5 MG PO TABS: 5.0000 mg | ORAL_TABLET | Freq: Every day | ORAL | 3 refills | Status: AC

## 2022-08-27 MED ORDER — ROSUVASTATIN CALCIUM 10 MG PO TABS: 10.0000 mg | ORAL_TABLET | Freq: Every day | ORAL | 3 refills | Status: AC

## 2022-09-21 DIAGNOSIS — G4733 Obstructive sleep apnea (adult) (pediatric): Secondary | ICD-10-CM | POA: Diagnosis not present

## 2023-01-11 DIAGNOSIS — M7661 Achilles tendinitis, right leg: Secondary | ICD-10-CM | POA: Diagnosis not present

## 2023-05-03 DIAGNOSIS — D3131 Benign neoplasm of right choroid: Secondary | ICD-10-CM | POA: Diagnosis not present

## 2023-05-05 DIAGNOSIS — N183 Chronic kidney disease, stage 3 unspecified: Secondary | ICD-10-CM | POA: Diagnosis not present

## 2023-05-05 DIAGNOSIS — I129 Hypertensive chronic kidney disease with stage 1 through stage 4 chronic kidney disease, or unspecified chronic kidney disease: Secondary | ICD-10-CM | POA: Diagnosis not present

## 2023-05-05 DIAGNOSIS — E7800 Pure hypercholesterolemia, unspecified: Secondary | ICD-10-CM | POA: Diagnosis not present

## 2023-05-05 DIAGNOSIS — Z0000 Encounter for general adult medical examination without abnormal findings: Secondary | ICD-10-CM | POA: Diagnosis not present

## 2023-07-13 DIAGNOSIS — R0981 Nasal congestion: Secondary | ICD-10-CM | POA: Diagnosis not present

## 2023-07-13 DIAGNOSIS — R509 Fever, unspecified: Secondary | ICD-10-CM | POA: Diagnosis not present

## 2023-07-13 DIAGNOSIS — R051 Acute cough: Secondary | ICD-10-CM | POA: Diagnosis not present

## 2023-07-13 DIAGNOSIS — R0982 Postnasal drip: Secondary | ICD-10-CM | POA: Diagnosis not present

## 2023-07-13 DIAGNOSIS — J101 Influenza due to other identified influenza virus with other respiratory manifestations: Secondary | ICD-10-CM | POA: Diagnosis not present

## 2023-10-01 DIAGNOSIS — R109 Unspecified abdominal pain: Secondary | ICD-10-CM | POA: Diagnosis not present

## 2023-10-01 DIAGNOSIS — Z79899 Other long term (current) drug therapy: Secondary | ICD-10-CM | POA: Diagnosis not present

## 2023-10-01 DIAGNOSIS — I10 Essential (primary) hypertension: Secondary | ICD-10-CM | POA: Diagnosis not present

## 2023-10-01 DIAGNOSIS — R1012 Left upper quadrant pain: Secondary | ICD-10-CM | POA: Diagnosis not present

## 2023-10-01 DIAGNOSIS — J9811 Atelectasis: Secondary | ICD-10-CM | POA: Diagnosis not present

## 2023-10-01 DIAGNOSIS — K3580 Unspecified acute appendicitis: Secondary | ICD-10-CM | POA: Diagnosis not present

## 2023-10-01 DIAGNOSIS — K353 Acute appendicitis with localized peritonitis, without perforation or gangrene: Secondary | ICD-10-CM | POA: Diagnosis not present

## 2023-10-01 DIAGNOSIS — R197 Diarrhea, unspecified: Secondary | ICD-10-CM | POA: Diagnosis not present

## 2023-10-01 DIAGNOSIS — K449 Diaphragmatic hernia without obstruction or gangrene: Secondary | ICD-10-CM | POA: Diagnosis not present

## 2023-10-02 DIAGNOSIS — K353 Acute appendicitis with localized peritonitis, without perforation or gangrene: Secondary | ICD-10-CM | POA: Diagnosis not present

## 2023-10-06 DIAGNOSIS — I129 Hypertensive chronic kidney disease with stage 1 through stage 4 chronic kidney disease, or unspecified chronic kidney disease: Secondary | ICD-10-CM | POA: Diagnosis not present

## 2023-10-06 DIAGNOSIS — R140 Abdominal distension (gaseous): Secondary | ICD-10-CM | POA: Diagnosis not present

## 2023-10-06 DIAGNOSIS — K3589 Other acute appendicitis without perforation or gangrene: Secondary | ICD-10-CM | POA: Diagnosis not present

## 2023-10-06 DIAGNOSIS — N183 Chronic kidney disease, stage 3 unspecified: Secondary | ICD-10-CM | POA: Diagnosis not present

## 2023-10-07 DIAGNOSIS — Z1330 Encounter for screening examination for mental health and behavioral disorders, unspecified: Secondary | ICD-10-CM | POA: Diagnosis not present

## 2023-10-07 DIAGNOSIS — K37 Unspecified appendicitis: Secondary | ICD-10-CM | POA: Diagnosis not present
